# Patient Record
Sex: Female | Born: 1965 | Race: Black or African American | Hispanic: No | Marital: Single | Smoking: Never smoker
Health system: Southern US, Community
[De-identification: ages and names within clinical notes are randomized; demographics above are authoritative.]

## PROBLEM LIST (undated history)

## (undated) DIAGNOSIS — J45909 Unspecified asthma, uncomplicated: Secondary | ICD-10-CM

## (undated) DIAGNOSIS — D729 Disorder of white blood cells, unspecified: Secondary | ICD-10-CM

## (undated) DIAGNOSIS — E669 Obesity, unspecified: Secondary | ICD-10-CM

## (undated) DIAGNOSIS — R011 Cardiac murmur, unspecified: Secondary | ICD-10-CM

## (undated) DIAGNOSIS — J302 Other seasonal allergic rhinitis: Secondary | ICD-10-CM

## (undated) DIAGNOSIS — J310 Chronic rhinitis: Secondary | ICD-10-CM

## (undated) DIAGNOSIS — N912 Amenorrhea, unspecified: Secondary | ICD-10-CM

## (undated) DIAGNOSIS — I10 Essential (primary) hypertension: Secondary | ICD-10-CM

## (undated) HISTORY — DX: Amenorrhea, unspecified: N91.2

## (undated) HISTORY — DX: Disorder of white blood cells, unspecified: D72.9

## (undated) HISTORY — DX: Chronic rhinitis: J31.0

## (undated) HISTORY — DX: Cardiac murmur, unspecified: R01.1

## (undated) HISTORY — DX: Other seasonal allergic rhinitis: J30.2

## (undated) HISTORY — DX: Obesity, unspecified: E66.9

## (undated) HISTORY — DX: Essential (primary) hypertension: I10

---

## 2013-08-23 ENCOUNTER — Encounter (HOSPITAL_COMMUNITY): Payer: Self-pay | Admitting: Emergency Medicine

## 2013-08-23 ENCOUNTER — Emergency Department (HOSPITAL_COMMUNITY): Payer: Self-pay

## 2013-08-23 ENCOUNTER — Emergency Department (HOSPITAL_COMMUNITY)
Admission: EM | Admit: 2013-08-23 | Discharge: 2013-08-23 | Disposition: A | Discharge status: Home / Self Care | Payer: Self-pay | Attending: Emergency Medicine | Admitting: Emergency Medicine

## 2013-08-23 DIAGNOSIS — B349 Viral infection, unspecified: Secondary | ICD-10-CM

## 2013-08-23 DIAGNOSIS — M940 Chondrocostal junction syndrome [Tietze]: Secondary | ICD-10-CM | POA: Insufficient documentation

## 2013-08-23 DIAGNOSIS — B9789 Other viral agents as the cause of diseases classified elsewhere: Secondary | ICD-10-CM | POA: Insufficient documentation

## 2013-08-23 DIAGNOSIS — Z880 Allergy status to penicillin: Secondary | ICD-10-CM | POA: Insufficient documentation

## 2013-08-23 DIAGNOSIS — J45909 Unspecified asthma, uncomplicated: Secondary | ICD-10-CM | POA: Insufficient documentation

## 2013-08-23 DIAGNOSIS — Z79899 Other long term (current) drug therapy: Secondary | ICD-10-CM | POA: Insufficient documentation

## 2013-08-23 HISTORY — DX: Unspecified asthma, uncomplicated: J45.909

## 2013-08-23 LAB — COMPREHENSIVE METABOLIC PANEL
ALT: 22 U/L (ref 0–35)
AST: 22 U/L (ref 0–37)
Albumin: 3.8 g/dL (ref 3.5–5.2)
Alkaline Phosphatase: 56 U/L (ref 39–117)
BUN: 10 mg/dL (ref 6–23)
CALCIUM: 9.1 mg/dL (ref 8.4–10.5)
CO2: 25 mEq/L (ref 19–32)
CREATININE: 0.97 mg/dL (ref 0.50–1.10)
Chloride: 103 mEq/L (ref 96–112)
GFR, EST AFRICAN AMERICAN: 80 mL/min — AB (ref >90)
GFR, EST NON AFRICAN AMERICAN: 69 mL/min — AB (ref >90)
GLUCOSE: 97 mg/dL (ref 70–99)
Potassium: 4 mEq/L (ref 3.7–5.3)
Sodium: 140 mEq/L (ref 137–147)
TOTAL PROTEIN: 7.6 g/dL (ref 6.0–8.3)
Total Bilirubin: 0.5 mg/dL (ref 0.3–1.2)

## 2013-08-23 LAB — CBC
HEMATOCRIT: 36.4 % (ref 36.0–46.0)
HEMOGLOBIN: 11.8 g/dL — AB (ref 12.0–15.0)
MCH: 24.9 pg — AB (ref 26.0–34.0)
MCHC: 32.4 g/dL (ref 30.0–36.0)
MCV: 76.8 fL — ABNORMAL LOW (ref 78.0–100.0)
Platelets: 366 10*3/uL (ref 150–400)
RBC: 4.74 MIL/uL (ref 3.87–5.11)
RDW: 13.1 % (ref 11.5–15.5)
WBC: 4.2 10*3/uL (ref 4.0–10.5)

## 2013-08-23 LAB — TROPONIN I

## 2013-08-23 MED ORDER — IBUPROFEN 800 MG PO TABS: 800.0000 mg | ORAL_TABLET | Freq: Three times a day (TID) | ORAL | Status: AC

## 2013-08-23 MED ORDER — BENZONATATE 100 MG PO CAPS: 100.0000 mg | ORAL_CAPSULE | Freq: Three times a day (TID) | ORAL | Status: AC

## 2013-08-23 MED ORDER — SODIUM CHLORIDE 0.9 % IV BOLUS (SEPSIS): 500.0000 mL | Freq: Once | INTRAVENOUS | Status: DC

## 2013-08-23 MED ORDER — ALBUTEROL SULFATE HFA 108 (90 BASE) MCG/ACT IN AERS
2.0000 | INHALATION_SPRAY | Freq: Once | RESPIRATORY_TRACT | Status: DC
  Filled 2013-08-23: qty 6.7

## 2013-08-23 NOTE — Progress Notes (Signed)
[facility] provided pt with a list of primary care resources, highlighting [facility]. Patient stated that she was "basically homeless" and living with different relatives.

## 2013-08-23 NOTE — Discharge Instructions (Signed)
Please call and set-up an appointment with Urgent Care Center to be re-evaluated Please rest and stay hydrated Please take medications as prescribed - please take on a full stomach Please use inhaler as needed for shortness of breath Please continue to monitor symptoms closely and if symptoms are to worsen or change (fever greater than 101, chills, neck pain, neck stiffness, chest pain, shortness of breath, difficulty breathing, coughing up blood, decreased appetite, vomiting, nausea, stomach pain, changes to appetite, changes to bowel movements and urine) please report back to the ED immediately  Viral Infections A virus is a type of germ. Viruses can cause:  Minor sore throats.  Aches and pains.  Headaches.  Runny nose.  Rashes.  Watery eyes.  Tiredness.  Coughs.  Loss of appetite.  Feeling sick to your stomach (nausea).  Throwing up (vomiting).  Watery poop (diarrhea). HOME CARE   Only take medicines as told by your doctor.  Drink enough water and fluids to keep your pee (urine) clear or pale yellow. Sports drinks are a good choice.  Get plenty of rest and eat healthy. Soups and broths with crackers or rice are fine. GET HELP RIGHT AWAY IF:   You have a very bad headache.  You have shortness of breath.  You have chest pain or neck pain.  You have an unusual rash.  You cannot stop throwing up.  You have watery poop that does not stop.  You cannot keep fluids down.  You or your child has a temperature by mouth above 102 F (38.9 C), not controlled by medicine.  Your baby is older than 3 months with a rectal temperature of 102 F (38.9 C) or higher.  Your baby is 34 months old or younger with a rectal temperature of 100.4 F (38 C) or higher. MAKE SURE YOU:   Understand these instructions.  Will watch this condition.  Will get help right away if you are not doing well or get worse. Document Released: 06/24/2008 Document Revised: 10/04/2011 Document  Reviewed: 11/17/2010 [facility] Patient Information 2014 [place], [place].   Emergency Department Resource Guide 1) Find a Doctor and Pay Out of Pocket Although you won't have to find out who is covered by your insurance plan, it is a good idea to ask around and get recommendations. You will then need to call the office and see if the doctor you have chosen will accept you as a new patient and what types of options they offer for patients who are self-pay. Some doctors offer discounts or will set up payment plans for their patients who do not have insurance, but you will need to ask so you aren't surprised when you get to your appointment.  2) Contact Your Local Health Department Not all health departments have doctors that can see patients for sick visits, but many do, so it is worth a call to see if yours does. If you don't know where your local health department is, you can check in your phone book. The CDC also has a tool to help you locate your state's health department, and many state websites also have listings of all of their local health departments.  3) Find a Walk-in Clinic If your illness is not likely to be very severe or complicated, you may want to try a walk in clinic. These are popping up all over the country in pharmacies, drugstores, and shopping centers. They're usually staffed by nurse practitioners or physician assistants that have been trained to treat common illnesses  and complaints. They're usually fairly quick and inexpensive. However, if you have serious medical issues or chronic medical problems, these are probably not your best option.  No Primary Care Doctor: - Call Health Connect at  [phone] - they can help you locate a primary care doctor that  accepts your insurance, provides certain services, etc. - Physician Referral Service- [phone]  Chronic Pain Problems: Organization         Address  Phone   Notes  [facility]  [phone] Patients  need to be referred by their primary care doctor.   Medication Assistance: Organization         Address  Phone   Notes  [facility] Medication [facility] [address] [place], [place] [address] [phone] --Must be a resident of [facility] -- Must have NO insurance coverage whatsoever (no Medicaid/ Medicare, etc.) -- The pt. MUST have a primary care doctor that directs their care regularly and follows them in the community   [facility]ist  [phone]   [employer]  [phone]    Agencies that provide inexpensive medical care: Organization         Address  Phone   Notes  [facility]  [phone]   [facility]    [phone]   [facility] [address] [place], [place] [address] [phone]   [facility] [address] [place], [place] [phone]   Planned Parenthood    [phone]   [facility]    [phone]   [facility]  [address], [place] Phone:  [phone], Fax:  [phone] Hours of Operation:  9 am - 6 pm, M-F.  Also accepts Medicaid/Medicare and self-pay.  [facility] for Children  [address], [place] Phone: [phone], Fax: [phone]. Hours of Operation:  8:30 am - 5:30 pm, M-F.  Also accepts Medicaid and self-pay.  [facility] [place] [address] [place] Phone: [phone]   [facility] [address] Natasha Bence [place], [place] [phone], Ext. [phone] Mondays & Thursdays: 7-9 AM.  First 15 patients are seen on a first come, first serve basis.    Medicaid-accepting [facility] Providers:  Organization         Address  Phone   Notes  [facility] [address], [place] [phone] Also accepts self-pay patients.  [facility] [address] Josephs [place], [place]  [phone]     [facility] [address], [place] [phone]   [facility] [address] [place] [phone]   Renaye Rakers [address], [place]   [phone] Only accepts [place] patients after they have their name applied to their card.   Self-Pay (no insurance) in [facility]:  Organization         Address  Phone   Notes  Sickle Cell Patients, [facility] [address] [place], [place] [phone]   [facility] [address] [place], [place] [phone]   [facility] [place]  [address], [place] [phone]   [facility]/Dr. Osei-Bonsu  [address], [place] or [address], [address],  [place] [phone] Phone number for both [facility] and [place] locations is the same.  [facility] [address], [place] [phone]) [phone]   [facility] [address] [place] [address], [place] or [address] [phone]   [facility] [address], [place] [phone], phone; [phone]) [phone], fax Sees patients 1st and 3rd Saturday of every month.  Must not qualify for public or private insurance (i.e. Medicaid, Medicare, [place] Health Choice, Veterans' Benefits)  Household income should be no more than 200% of the poverty level The clinic cannot treat you if you are pregnant or think you are pregnant  Sexually transmitted diseases are not treated at the clinic.    Dental Care: Organization         Address  Phone  Notes  [facility] [address], [place] [phone] Accepts children up to age 47 who are enrolled in [place] or [place] Health Choice; pregnant women with a Medicaid card; and children who have applied for Medicaid or [place] Health Choice, but were declined,  whose parents can pay a reduced fee at time of service.  [facility] [place]  [address], [place] [phone] Accepts children up to age 47 who are enrolled in [place] or [place] Health Choice; pregnant women with a Medicaid card; and children who have applied for Medicaid or [place] Health Choice, but were declined, whose parents can pay a reduced fee at time of service.  [facility]  [address], [place] [phone] Patients are seen by appointment only. Walk-ins are not accepted. [facility] will see patients 47 years of age and older. Monday - Tuesday (8am-5pm) Most Wednesdays (8:30-5pm) $30 per visit, cash only  [facility]  [address], [facility] [phone] Patients are seen by appointment only. Walk-ins are not accepted. [facility] will see patients 47 years of age and older. One Wednesday Evening (Monthly: Volunteer Based).  $30 per visit, cash only  [facility]  [phone] for adults; Children under age 744, call Graduate Pediatric Dentistry at [phone]. Children aged 514-14, please call [phone] to request a pediatric application.  Dental services are provided in all areas of dental care including fillings, crowns and bridges, complete and partial dentures, implants, gum treatment, root canals, and extractions. Preventive care is also provided. Treatment is provided to both adults and children. Patients are selected via a lottery and there is often a waiting list.   [facility] [address], [place]  [phone] [email or web address]   [facility] [address], [place], [place] [phone], Ext. [phone] Second and Fourth Thursday of each month, opens at 6:30 AM; Clinic ends at 9 AM.  Patients are seen on a first-come first-served basis, and a limited number are seen during each clinic.   [facility]  [address], [place], [place] [phone]   Eligibility Requirements You must have lived in [place], [place], or [place] counties for at least the last three months.   You cannot be eligible for state or federal sponsored National Cityhealthcare insurance, including CIGNAVeterans Administration, [place]icaid, or [employer]icare.   You generally cannot be eligible for healthcare insurance through your employer.    How to apply: Eligibility screenings are held every Tuesday and  Wednesday afternoon from 1:00 pm until 4:00 pm. You do not need an appointment for the interview!  [facility] [address] [place], [place] [phone]   [facility] Health Department  [phone]   [facility] Health Department  [phone]   [facility] Health Department  [phone]    Behavioral Health Resources in the Community: Intensive Outpatient Programs Organization         Address  Phone  Notes  [facility] [address] [place], [place] [phone]   [facility] [address], [place], [place] [phone]   [facility] [address] [place], [place]  [phone]   [facility] [address],  [place], [place] [phone] or [phone]   Substance Abuse Resources Organization         Address  Phone  Notes  [facility]  [phone]   [facility]  [phone]   [facility] [place]  [phone]   Floydene Flock  [phone]   Residential & Outpatient Substance Abuse Program  [phone]   Psychological Services Organization         Address  Phone  Notes  [facility]  [phone]   [facility]  [phone]   [facility] Health [address] [place] [phone] or [phone]    Mobile Crisis Teams Organization         Address  Phone  Notes  [facility], Mobile Crisis Care Unit   [phone]   [facility]  [address] [place], [place] [phone]   Doristine Locks [address] [place] [phone]    Self-Help/Support Groups Organization         Address  Phone             Notes  [facility] [place] - variety of support groups  336- I7437963 Call for more information  Narcotics Anonymous (NA), [facility] [address], [employer] [place]  2 meetings at this location   Statistician         Address  Phone  Notes  [facility] [address],    [place]  [phone]   [facility]  [address], [place] [address], [place], [place] [phone]   [facility] [address] [place], [place] [phone] Admissions: 8am-3pm M-F  [facility] [address],    [place], [place] [phone]   [facility] [address] [place], [place], [place] [phone]   [facility] [address],  [place], [place] [phone]   [facility] - Intensive Outpatient [address]ure[address], [place], [place] [phone]   [facility] ([facility]iction [facility] [address] [place]  [place], [place] [phone] or [phone]   [facility] ([facility]) [address], [place], [place] [phone] Accepts Medicaid  [facility] [address]  [place] [phone] Substance Abuse/Addiction Treatment   [facility] Organization         Address  Phone  Notes  [facility]  [phone]   Angie Fava, PhD [address] Ervin Knack [place], [place]   [phone] or [phone]   [facility] Behavioral   [address] [place], [place] [phone]   [facility] [address] [place], [place] [phone] Insurance/Medicaid/sponsorship through [employer] and  Families [address]                                     [place], [place] [phone] Therapy/tele-psych/case  [facility] [address]   [place], [place] [phone]    Dr. Lolly Mustache  [phone]   [facility]  [employer] [facility] Dept. 1) [address] [place] 2) [address], [place] 3)  [address], [place] [phone]  [phone]   [facility] Child Abuse Hotline 479-366-2649 or [phone] (After Hours)

## 2013-08-23 NOTE — ED Notes (Signed)
Per pt, states cold/flu symptoms for about 2 weeks-increase cough and chest congestion/discomfort with cough

## 2013-08-23 NOTE — ED Notes (Signed)
Pt ambulated to bathroom without difficulty.

## 2013-08-23 NOTE — ED Provider Notes (Signed)
CSN: [id number]     Arrival date & time 08/23/13  1402 History   First MD Initiated Contact with Patient 08/23/13 1426     Chief Complaint  Patient presents with  . chest congestion   . Cough   (Consider location/radiation/quality/duration/timing/severity/associated sxs/prior Treatment) The history is provided by the patient. No language interpreter was used.  Dana Moses is a 48 y/o F with PMHx of asthma presenting to the ED with dry cough, nasal congestion, chest tightness, and headache that has been ongoing for the past 2 weeks. Patient reported that in the beginning her symptoms were flu-like with chills, generalized bodyaches and fever - patient reported that these symptoms have now passed, but the cough still lingers. Patient reported that when she coughs she gets chest tightness. Reported that while at rest her chest has a "raw" sensation, with a "vice" sensation when she coughs. Patient reported that she has been using Nyquil and Mucinex with negative relief. Patient reported that she reported the chest tightness is worse with coughing. Denied sore throat, difficulty swallowing, dizziness, abdominal pain, nausea, diarrhea, melena, hematochezia, neck pain, neck stiffness, fever, chills, sick contacts, hemoptysis, periphereal edema.  PCP none   Past Medical History  Diagnosis Date  . Asthma    History reviewed. No pertinent past surgical history. No family history on file. History  Substance Use Topics  . Smoking status: Never Smoker   . Smokeless tobacco: Not on file  . Alcohol Use: No   OB History   Grav Para Term Preterm Abortions TAB SAB Ect Mult Living                 Review of Systems  Constitutional: Negative for fever and chills.  HENT: Negative for sore throat and trouble swallowing.   Respiratory: Positive for cough and chest tightness.   Cardiovascular: Negative for chest pain.  Gastrointestinal: Negative for nausea, vomiting, abdominal pain and diarrhea.   Musculoskeletal: Negative for back pain, neck pain and neck stiffness.  Neurological: Positive for headaches. Negative for dizziness and weakness.  All other systems reviewed and are negative.    Allergies  Penicillins  Home Medications   Current Outpatient Rx  Name  Route  Sig  Dispense  Refill  . ibuprofen (ADVIL,MOTRIN) 800 MG tablet   Oral   Take 800 mg by mouth every 8 (eight) hours as needed.         Marland Kitchen Phenylephrine-APAP-Guaifenesin (MUCINEX FAST-MAX COLD & SINUS PO)   Oral   Take 30 mLs by mouth daily.         . Pseudoeph-Doxylamine-DM-APAP (NYQUIL PO)   Oral   Take 30 mLs by mouth at bedtime.         . benzonatate (TESSALON) 100 MG capsule   Oral   Take 1 capsule (100 mg total) by mouth every 8 (eight) hours.   17 capsule   0   . ibuprofen (ADVIL,MOTRIN) 800 MG tablet   Oral   Take 1 tablet (800 mg total) by mouth 3 (three) times daily.   21 tablet   0    BP 138/86  Pulse 68  Temp(Src) 98.6 F (37 C) (Oral)  Resp 16  SpO2 98%  LMP 08/11/2013 Physical Exam  Nursing note and vitals reviewed. Constitutional: She is oriented to person, place, and time. She appears well-developed and well-nourished. No distress.  HENT:  Head: Normocephalic and atraumatic.  Mouth/Throat: Oropharynx is clear and moist. No oropharyngeal exudate.  Eyes: Conjunctivae and EOM are normal.  Pupils are equal, round, and reactive to light. Right eye exhibits no discharge. Left eye exhibits no discharge.  Neck: Normal range of motion. Neck supple. No tracheal deviation present.  Cardiovascular: Normal rate, regular rhythm and normal heart sounds.  Exam reveals no friction rub.   No murmur heard. Pulses:      Radial pulses are 2+ on the right side, and 2+ on the left side.       Dorsalis pedis pulses are 2+ on the right side, and 2+ on the left side.  Negative bilateral swelling or pitting edema noted Negative changes to colors of the legs Cap refill < 3 seconds    Pulmonary/Chest: Effort normal and breath sounds normal. No respiratory distress. She has no wheezes. She has no rales. She exhibits no tenderness.  Patient is able to speak in full sentences without difficulty Negative use of accessory muscles Negative signs of respiratory distress  Musculoskeletal: Normal range of motion.  Full ROM to upper and lower extremities without difficulty noted, negative ataxia noted.  Lymphadenopathy:    She has no cervical adenopathy.  Neurological: She is alert and oriented to person, place, and time. No cranial nerve deficit. She exhibits normal muscle tone. Coordination normal.  Skin: Skin is warm and dry. No rash noted. She is not diaphoretic. No erythema.  Psychiatric: She has a normal mood and affect. Her behavior is normal. Thought content normal.    ED Course  Procedures (including critical care time)  4:49 PM This provider was made aware that the patient does not want to stay for fluids. Reported that patient needed to leave to be somewhere around 5:00PM. This provider discussed lab findings with the patient and imaging results. Patient reported that she is feeling fine and really just wanted something to control her cough.   Results for orders placed during the hospital encounter of 08/23/13  CBC      Result Value Range   WBC 4.2  4.0 - 10.5 K/uL   RBC 4.74  3.87 - 5.11 MIL/uL   Hemoglobin 11.8 (*) 12.0 - 15.0 g/dL   HCT 40.9  81.1 - 91.4 %   MCV 76.8 (*) 78.0 - 100.0 fL   MCH 24.9 (*) 26.0 - 34.0 pg   MCHC 32.4  30.0 - 36.0 g/dL   RDW 78.2  95.6 - 21.3 %   Platelets 366  150 - 400 K/uL  COMPREHENSIVE METABOLIC PANEL      Result Value Range   Sodium 140  137 - 147 mEq/L   Potassium 4.0  3.7 - 5.3 mEq/L   Chloride 103  96 - 112 mEq/L   CO2 25  19 - 32 mEq/L   Glucose, Bld 97  70 - 99 mg/dL   BUN 10  6 - 23 mg/dL   Creatinine, Ser 0.86  0.50 - 1.10 mg/dL   Calcium 9.1  8.4 - 57.8 mg/dL   Total Protein 7.6  6.0 - 8.3 g/dL   Albumin 3.8   3.5 - 5.2 g/dL   AST 22  0 - 37 U/L   ALT 22  0 - 35 U/L   Alkaline Phosphatase 56  39 - 117 U/L   Total Bilirubin 0.5  0.3 - 1.2 mg/dL   GFR calc non Af Amer 69 (*) >90 mL/min   GFR calc Af Amer 80 (*) >90 mL/min  TROPONIN I      Result Value Range   Troponin I <0.30  <0.30 ng/mL   Dg  Chest Port 1 View  08/23/2013   CLINICAL DATA:  Cough and congestion and chest pain with history of asthma  EXAM: PORTABLE CHEST - 1 VIEW  COMPARISON:  None.  FINDINGS: The lungs are adequately inflated. There is minimal prominence of the perihilar lung markings on the right that may reflect subsegmental atelectasis or scarring. There is no evidence of pneumonia. The cardiac silhouette is normal in size. The pulmonary vascularity is not engorged. There is no pleural effusion.  IMPRESSION: There is no evidence of pneumonia. Minimal subsegmental atelectasis versus scarring in the right perihilar region is present.   Electronically Signed   By: David  [place]   On: 08/23/2013 14:45   Labs Review Labs Reviewed  CBC - Abnormal; Notable for the following:    Hemoglobin 11.8 (*)    MCV 76.8 (*)    MCH 24.9 (*)    All other components within normal limits  COMPREHENSIVE METABOLIC PANEL - Abnormal; Notable for the following:    GFR calc non Af Amer 69 (*)    GFR calc Af Amer 80 (*)    All other components within normal limits  TROPONIN I   Imaging Review Dg Chest Port 1 View  08/23/2013   CLINICAL DATA:  Cough and congestion and chest pain with history of asthma  EXAM: PORTABLE CHEST - 1 VIEW  COMPARISON:  None.  FINDINGS: The lungs are adequately inflated. There is minimal prominence of the perihilar lung markings on the right that may reflect subsegmental atelectasis or scarring. There is no evidence of pneumonia. The cardiac silhouette is normal in size. The pulmonary vascularity is not engorged. There is no pleural effusion.  IMPRESSION: There is no evidence of pneumonia. Minimal subsegmental atelectasis versus  scarring in the right perihilar region is present.   Electronically Signed   By: David  [place]   On: 08/23/2013 14:45    EKG Interpretation    Date/Time:  Thursday August 23 2013 14:10:05 EST Ventricular Rate:  57 PR Interval:  192 QRS Duration: 68 QT Interval:  397 QTC Calculation: 386 R Axis:   60 Text Interpretation:  Sinus rhythm Low voltage, precordial leads Baseline wander in lead(s) II Confirmed by Rhunette Croft, MD, ANKIT ([id number]) on 08/23/2013 4:28:37 PM            MDM   1. Viral syndrome   2. Costochondritis     Medications  sodium chloride 0.9 % bolus 500 mL (500 mLs Intravenous Not Given 08/23/13 1656)  albuterol (PROVENTIL HFA;VENTOLIN HFA) 108 (90 BASE) MCG/ACT inhaler 2 puff (2 puffs Inhalation Not Given 08/23/13 1656)    Filed Vitals:   08/23/13 1405 08/23/13 1647  BP: 149/98 138/86  Pulse: 60 68  Temp: 98.4 F (36.9 C) 98.6 F (37 C)  TempSrc: Oral Oral  Resp: 20 16  SpO2: 100% 98%    Patient presenting to emergency part with dry cough, headache, nasal congestion, chest tightness that has been ongoing for the past 2 weeks. Stated that she experiences chest pain with cough. Reported that she's been using over-the-counter NyQuil, Mucinex with minimal relief.  Alert and oriented. GCS 15. Heart rate and rhythm normal. Lungs clear to auscultation bilaterally to upper and lower lobes. Radial and DP pulses 2+ bilaterally. Cap refill less than 3 seconds. Negative bilateral swelling or pitting edema noted to lower extremities. EKG noted normal sinus rhythm with a heart rate of 57 beats per minute. Troponin negative elevation. CBC negative elevation white blood cell count noted. CMP negative  findings. Chest x-ray negative for pneumonia-minimal subsegmental atelectasis versus scarring in the right perihilar region is present. Negative findings for pleural effusion or pneumothorax. 4:49 PM This provider was made aware that the patient wanted to leave. Patient did not want  to stay for IV fluids to be administered. Patient reported that she had somewhere to be at 5:00PM. This provider spoke with the patient and the patient reported that she is feeling fine, she just wanted something for her cough. Patient reported that she is in no pain. IV fluids noted administered.  Negative findings for pneumonia. Negative for pneumothorax. Doubt fluid overload or CHF exacerbation. PERC score 0 - doubt PE. Doubt cardiac issue. Suspicion to be viral syndrome and chest discomfort secondary to cough - suspicion to be costochondritis. Patient does not meet sepsis criteria. Patient stable, afebrile. Discharged patient. Discharged patient with albuterol, ibuprofen, and tessalon. Discussed with patient to rest and stay hydrated. Discussed with patient that it will take a week or two before she starts to feel back to normal. Discussed with patient to closely monitor symptoms and if symptoms are to worsen or change to report back to the ED - strict return instructions given.  Patient agreed to plan of care, understood, all questions answered.   Raymon MuttonMarissa Arlan Birks, PA-C 08/23/13 1806

## 2013-08-24 NOTE — ED Provider Notes (Signed)
Medical screening examination/treatment/procedure(s) were performed by non-physician practitioner and as supervising physician I was immediately available for consultation/collaboration.  EKG Interpretation    Date/Time:  Thursday August 23 2013 14:10:05 EST Ventricular Rate:  57 PR Interval:  192 QRS Duration: 68 QT Interval:  397 QTC Calculation: 386 R Axis:   60 Text Interpretation:  Sinus rhythm Low voltage, precordial leads Baseline wander in lead(s) II Confirmed by Rhunette CroftNANAVATI, MD, Shamel Galyean ([id number]) on 08/23/2013 4:28:37 PM             Derwood KaplanAnkit Shawan Tosh, MD 08/24/13 [id number]

## 2014-10-14 ENCOUNTER — Other Ambulatory Visit: Payer: Self-pay | Admitting: Family Medicine

## 2014-10-14 DIAGNOSIS — N6320 Unspecified lump in the left breast, unspecified quadrant: Secondary | ICD-10-CM

## 2014-11-05 ENCOUNTER — Other Ambulatory Visit: Payer: Self-pay | Admitting: Family Medicine

## 2014-11-05 ENCOUNTER — Other Ambulatory Visit: Payer: Self-pay

## 2014-11-05 DIAGNOSIS — N6320 Unspecified lump in the left breast, unspecified quadrant: Secondary | ICD-10-CM

## 2014-11-06 ENCOUNTER — Other Ambulatory Visit: Payer: Self-pay

## 2014-11-13 ENCOUNTER — Other Ambulatory Visit: Payer: Self-pay

## 2017-10-11 ENCOUNTER — Other Ambulatory Visit: Payer: Self-pay | Admitting: Family Medicine

## 2017-10-11 ENCOUNTER — Ambulatory Visit
Admission: RE | Admit: 2017-10-11 | Discharge: 2017-10-11 | Disposition: A | Discharge status: Home / Self Care | Payer: BC Managed Care – PPO | Source: Ambulatory Visit | Attending: Family Medicine | Admitting: Family Medicine

## 2017-10-11 DIAGNOSIS — R059 Cough, unspecified: Secondary | ICD-10-CM

## 2017-10-11 DIAGNOSIS — R05 Cough: Secondary | ICD-10-CM

## 2018-01-24 ENCOUNTER — Other Ambulatory Visit (HOSPITAL_COMMUNITY)
Admission: RE | Admit: 2018-01-24 | Discharge: 2018-01-24 | Disposition: A | Discharge status: Home / Self Care | Payer: BC Managed Care – PPO | Source: Ambulatory Visit | Attending: Family Medicine | Admitting: Family Medicine

## 2018-01-24 ENCOUNTER — Other Ambulatory Visit: Payer: Self-pay | Admitting: Family Medicine

## 2018-01-24 DIAGNOSIS — Z01411 Encounter for gynecological examination (general) (routine) with abnormal findings: Secondary | ICD-10-CM | POA: Insufficient documentation

## 2018-01-25 LAB — CYTOLOGY - PAP: Diagnosis: NEGATIVE

## 2019-07-04 ENCOUNTER — Other Ambulatory Visit: Payer: Self-pay

## 2019-07-04 ENCOUNTER — Emergency Department: Payer: BC Managed Care – PPO

## 2019-07-04 ENCOUNTER — Emergency Department
Admission: EM | Admit: 2019-07-04 | Discharge: 2019-07-05 | Disposition: A | Discharge status: Home / Self Care | Payer: BC Managed Care – PPO | Attending: Emergency Medicine | Admitting: Emergency Medicine

## 2019-07-04 ENCOUNTER — Encounter: Payer: Self-pay | Admitting: Emergency Medicine

## 2019-07-04 DIAGNOSIS — R200 Anesthesia of skin: Secondary | ICD-10-CM | POA: Diagnosis not present

## 2019-07-04 DIAGNOSIS — R030 Elevated blood-pressure reading, without diagnosis of hypertension: Secondary | ICD-10-CM | POA: Diagnosis not present

## 2019-07-04 DIAGNOSIS — M545 Low back pain, unspecified: Secondary | ICD-10-CM

## 2019-07-04 DIAGNOSIS — J45909 Unspecified asthma, uncomplicated: Secondary | ICD-10-CM | POA: Insufficient documentation

## 2019-07-04 DIAGNOSIS — M79602 Pain in left arm: Secondary | ICD-10-CM | POA: Diagnosis not present

## 2019-07-04 DIAGNOSIS — M546 Pain in thoracic spine: Secondary | ICD-10-CM | POA: Diagnosis present

## 2019-07-04 DIAGNOSIS — R202 Paresthesia of skin: Secondary | ICD-10-CM | POA: Diagnosis not present

## 2019-07-04 LAB — BASIC METABOLIC PANEL
Anion gap: 8 (ref 5–15)
BUN: 20 mg/dL (ref 6–20)
CO2: 29 mmol/L (ref 22–32)
Calcium: 9.3 mg/dL (ref 8.9–10.3)
Chloride: 104 mmol/L (ref 98–111)
Creatinine, Ser: 1.2 mg/dL — ABNORMAL HIGH (ref 0.44–1.00)
GFR calc Af Amer: >60 mL/min (ref >60)
GFR calc non Af Amer: 52 mL/min — ABNORMAL LOW (ref >60)
Glucose, Bld: 97 mg/dL (ref 70–99)
Potassium: 3.6 mmol/L (ref 3.5–5.1)
Sodium: 141 mmol/L (ref 135–145)

## 2019-07-04 LAB — CBC
HCT: 39.7 % (ref 36.0–46.0)
Hemoglobin: 12.5 g/dL (ref 12.0–15.0)
MCH: 24.3 pg — ABNORMAL LOW (ref 26.0–34.0)
MCHC: 31.5 g/dL (ref 30.0–36.0)
MCV: 77.2 fL — ABNORMAL LOW (ref 80.0–100.0)
Platelets: 325 10*3/uL (ref 150–400)
RBC: 5.14 MIL/uL — ABNORMAL HIGH (ref 3.87–5.11)
RDW: 13.1 % (ref 11.5–15.5)
WBC: 5.7 10*3/uL (ref 4.0–10.5)
nRBC: 0 % (ref 0.0–0.2)

## 2019-07-04 LAB — TROPONIN I (HIGH SENSITIVITY)
Troponin I (High Sensitivity): 8 ng/L (ref <18)
Troponin I (High Sensitivity): 8 ng/L (ref <18)

## 2019-07-04 MED ORDER — CYCLOBENZAPRINE HCL 10 MG PO TABS
5.0000 mg | ORAL_TABLET | Freq: Once | ORAL | Status: AC
  Administered 2019-07-04: 5 mg via ORAL
  Filled 2019-07-04: qty 1

## 2019-07-04 MED ORDER — LIDOCAINE 5 % EX PTCH: 1.0000 | MEDICATED_PATCH | Freq: Two times a day (BID) | CUTANEOUS | 0 refills | Status: AC

## 2019-07-04 MED ORDER — IOHEXOL 350 MG/ML SOLN
100.0000 mL | Freq: Once | INTRAVENOUS | Status: AC | PRN
  Administered 2019-07-04: 100 mL via INTRAVENOUS

## 2019-07-04 MED ORDER — CYCLOBENZAPRINE HCL 5 MG PO TABS: 5.0000 mg | ORAL_TABLET | Freq: Three times a day (TID) | ORAL | 0 refills | Status: AC | PRN

## 2019-07-04 MED ORDER — ACETAMINOPHEN 500 MG PO TABS
1000.0000 mg | ORAL_TABLET | Freq: Once | ORAL | Status: DC
  Filled 2019-07-04: qty 2

## 2019-07-04 MED ORDER — ALBUTEROL SULFATE HFA 108 (90 BASE) MCG/ACT IN AERS: 2.0000 | INHALATION_SPRAY | Freq: Four times a day (QID) | RESPIRATORY_TRACT | 0 refills | Status: AC | PRN

## 2019-07-04 MED ORDER — LIDOCAINE 5 % EX PTCH
1.0000 | MEDICATED_PATCH | CUTANEOUS | Status: DC
  Filled 2019-07-04: qty 1

## 2019-07-04 NOTE — ED Triage Notes (Signed)
Pt here for right upper back pain for one week. Was at urgent care and they sent here for HTN. Pt denies hx of HTN.  Pt denies chest pain but for past 4 days has had a discomfort in her left arm with a tingling sensation at times.  Pain in arm is intermittent.  Present when at rest.

## 2019-07-04 NOTE — ED Notes (Signed)
Pt sitting in lobby with no distress noted; pt updated on wait time and taken to triage for vs and repeat troponin

## 2019-07-04 NOTE — ED Provider Notes (Signed)
[facility] Emergency Department Provider Note   ____________________________________________   First MD Initiated Contact with Patient 07/04/19 2145     (approximate)  I have reviewed the triage vital signs and the nursing notes.   HISTORY  Chief Complaint Back Pain and Arm Pain    HPI Dana Moses is a 53 y.o. female with past history of asthma who presents to the ED complaining of back pain and elevated blood pressure.  Patient reports she has been dealing with pain in her right mid back for the past 4 to 5 days.  She describes it as a dull ache that is not exacerbated or alleviated by anything.  She has not had any associated chest pain or shortness of breath, denies any recent fevers or cough.  She has not noticed any pain or swelling in her lower extremities and denies any recent surgery.  She does report occasional episodes of pain moving into her left arm associated with a numb and tingly feeling.  This sensation is not currently present.  She has been taking ibuprofen at home for this without relief.  She sought care in urgent care earlier today, where she was found to be significantly hypertensive.  She denies any history of hypertension and has never take medication for her blood pressure.  UA was obtained at urgent care and patient states it was negative, but she was referred to the ED for further evaluation for possible dissection.  She denies any saddle anesthesia, difficulty urinating, or weakness in her legs.        Past Medical History:  Diagnosis Date   Asthma     There are no active problems to display for this patient.   History reviewed. No pertinent surgical history.  Prior to Admission medications   Medication Sig Start Date End Date Taking? Authorizing Provider  albuterol (VENTOLIN HFA) 108 (90 Base) MCG/ACT inhaler Inhale 2 puffs into the lungs every 6 (six) hours as needed for wheezing or shortness of breath. 07/04/19    Chesley Noon, MD  benzonatate (TESSALON) 100 MG capsule Take 1 capsule (100 mg total) by mouth every 8 (eight) hours. 08/23/13   Sciacca, Marissa, PA-C  cyclobenzaprine (FLEXERIL) 5 MG tablet Take 1 tablet (5 mg total) by mouth 3 (three) times daily as needed for muscle spasms. 07/04/19   Chesley Noon, MD  ibuprofen (ADVIL,MOTRIN) 800 MG tablet Take 800 mg by mouth every 8 (eight) hours as needed.    [provider]  ibuprofen (ADVIL,MOTRIN) 800 MG tablet Take 1 tablet (800 mg total) by mouth 3 (three) times daily. 08/23/13   Sciacca, Marissa, PA-C  lidocaine (LIDODERM) 5 % Place 1 patch onto the skin every 12 (twelve) hours. Remove & Discard patch within 12 hours or as directed by MD 07/04/19 07/03/20  Chesley Noon, MD  Phenylephrine-APAP-Guaifenesin [facility] FAST-MAX COLD & SINUS PO) Take 30 mLs by mouth daily.    [provider]  Pseudoeph-Doxylamine-DM-APAP (NYQUIL PO) Take 30 mLs by mouth at bedtime.    [provider]    Allergies Penicillins  History reviewed. No pertinent family history.  Social History Social History   Tobacco Use   Smoking status: Never Smoker   Smokeless tobacco: Never Used  Substance Use Topics   Alcohol use: No   Drug use: No    Review of Systems  Constitutional: No fever/chills Eyes: No visual changes. ENT: No sore throat. Cardiovascular: Denies chest pain. Respiratory: Denies shortness of breath. Gastrointestinal: No abdominal pain.  No nausea, no vomiting.  No diarrhea.  No constipation. Genitourinary: Negative for dysuria. Musculoskeletal: Positive for back pain. Skin: Negative for rash. Neurological: Negative for headaches, negative for focal weakness, positive for numbness/tingling.  ____________________________________________   PHYSICAL EXAM:  VITAL SIGNS: ED Triage Vitals  Enc Vitals Group     BP 07/04/19 1751 (!) 199/101     Pulse Rate 07/04/19 1750 82     Resp 07/04/19 1750 18     Temp  07/04/19 1750 98.9 F (37.2 C)     Temp Source 07/04/19 1750 Oral     SpO2 07/04/19 1750 100 %     Weight 07/04/19 1749 219 lb (99.3 kg)     Height 07/04/19 1749 5\' 4"  (1.626 m)     Head Circumference --      Peak Flow --      Pain Score 07/04/19 1749 4     Pain Loc --      Pain Edu? --      Excl. in GC? --     Constitutional: Alert and oriented. Eyes: Conjunctivae are normal. Head: Atraumatic. Nose: No congestion/rhinnorhea. Mouth/Throat: Mucous membranes are moist. Neck: Normal ROM Cardiovascular: Normal rate, regular rhythm. Grossly normal heart sounds.  2+ radial pulses bilaterally, 2+ DP pulses bilaterally. Respiratory: Normal respiratory effort.  No retractions. Lungs CTAB. Gastrointestinal: Soft and nontender. No distention. Genitourinary: deferred Musculoskeletal: No lower extremity tenderness nor edema. Neurologic:  Normal speech and language. No gross focal neurologic deficits are appreciated.  5 out of 5 strength in bilateral lower extremities.  Sensation intact throughout. Skin:  Skin is warm, dry and intact. No rash noted. Psychiatric: Mood and affect are normal. Speech and behavior are normal.  ____________________________________________   LABS (all labs ordered are listed, but only abnormal results are displayed)  Labs Reviewed  BASIC METABOLIC PANEL - Abnormal; Notable for the following components:      Result Value   Creatinine, Ser 1.20 (*)    GFR calc non Af Amer 52 (*)    All other components within normal limits  CBC - Abnormal; Notable for the following components:   RBC 5.14 (*)    MCV 77.2 (*)    MCH 24.3 (*)    All other components within normal limits  TROPONIN I (HIGH SENSITIVITY)  TROPONIN I (HIGH SENSITIVITY)   ____________________________________________  EKG  ED ECG REPORT I, Chesley Noonharles Benedicto Capozzi, the attending physician, personally viewed and interpreted this ECG.   Date: 07/04/2019  EKG Time: 17:48  Rate: 87  Rhythm: normal sinus  rhythm  Axis: Normal  Intervals:none  ST&T Change: None   PROCEDURES  Procedure(s) performed (including Critical Care):  Procedures   ____________________________________________   INITIAL IMPRESSION / ASSESSMENT AND PLAN / ED COURSE       53 year old female with past medical history of asthma presents to the ED for a few days of back pain as well as some pain in her left arm with associated numbness and tingling today.  She was noted to have significantly elevated blood pressure at urgent care and referred to the ED for further evaluation.  Given her back pain as well as arm pain with paresthesias, will assess for dissection.  She does have intact pulses in all extremities.  EKG shows no acute changes and 2 sets of troponin are within normal limits.  Chest x-ray negative for acute process and remainder of labs are unremarkable.  If CTA negative, she would be appropriate for outpatient management.  I doubt  cauda equina given lack of concerning symptoms.  CTA negative for acute process.  Patient with improvement in symptoms following Flexeril and Lidoderm patch.  Will prescribe these for home use, counseled patient to follow-up with PCP for reevaluation of her blood pressure once back pain is better under control.      ____________________________________________   FINAL CLINICAL IMPRESSION(S) / ED DIAGNOSES  Final diagnoses:  Acute right-sided low back pain without sciatica  Elevated BP without diagnosis of hypertension     ED Discharge Orders         Ordered    lidocaine (LIDODERM) 5 %  Every 12 hours     07/04/19 2342    cyclobenzaprine (FLEXERIL) 5 MG tablet  3 times daily PRN     07/04/19 2342    albuterol (VENTOLIN HFA) 108 (90 Base) MCG/ACT inhaler  Every 6 hours PRN     07/04/19 2349           Note:  This document was prepared using Dragon voice recognition software and may include unintentional dictation errors.   Blake Divine, MD 07/04/19 2351

## 2019-07-04 NOTE — ED Triage Notes (Signed)
Pt sent by [facility] for hypertensive crisis, left ar tingling and back pain.

## 2019-07-04 NOTE — ED Notes (Signed)
Pt repositioned in bed. Pt given pillow and warm blanket

## 2019-07-04 NOTE — ED Notes (Signed)
Patient transported to CT 

## 2020-01-31 NOTE — Progress Notes (Deleted)
Cardiology Office Note:    Date:  01/31/2020   ID:  Dana Moses, DOB 04/20/1966, MRN [id number]  PCP:  System, Pcp Not In  Cardiologist:  No primary care provider on file.   Referring MD: Shon Hale, *   No chief complaint on file.   History of Present Illness:    Dana Moses is a 54 y.o. female with a hx of obesity, hypertension, asthma and heart murmur referred by Dr.Fernanda Ludwig Clarks for cardiac evaluation.  ***  Past Medical History:  Diagnosis Date   Abnormal WBC count    Amenorrhea    Asthma    Cardiac murmur    Chronic rhinitis    Hypertension    Obesity    Seasonal allergies     No past surgical history on file.  Current Medications: No outpatient medications have been marked as taking for the 02/01/20 encounter (Appointment) with Lyn Records, MD.     Allergies:   Penicillins   Social History   Socioeconomic History   Marital status: Single    Spouse name: Not on file   Number of children: Not on file   Years of education: Not on file   Highest education level: Not on file  Occupational History   Not on file  Tobacco Use   Smoking status: Never Smoker   Smokeless tobacco: Never Used  Substance and Sexual Activity   Alcohol use: No   Drug use: No   Sexual activity: Not on file  Other Topics Concern   Not on file  Social History Narrative   Not on file   Social Determinants of Health   Financial Resource Strain:    Difficulty of Paying Living Expenses:   Food Insecurity:    Worried About Running Out of Food in the Last Year:    Merchant navy officer of Food in the Last Year:   Transportation Needs:    Freight forwarder (Medical):    Lack of Transportation (Non-Medical):   Physical Activity:    Days of Exercise per Week:    Minutes of Exercise per Session:   Stress:    Feeling of Stress :   Social Connections:    Frequency of Communication with Friends and Family:    Frequency of Social  Gatherings with Friends and Family:    Attends Religious Services:    Active Member of Clubs or Organizations:    Attends Banker Meetings:    Marital Status:      Family History: The patient's family history is not on file.  ROS:   Please see the history of present illness.    ***. All other systems reviewed and are negative.  EKGs/Labs/Other Studies Reviewed:    The following studies were reviewed today: ***  EKG:  EKG ***  Recent Labs: 07/04/2019: BUN 20; Creatinine, Ser 1.20; Hemoglobin 12.5; Platelets 325; Potassium 3.6; Sodium 141  Recent Lipid Panel No results found for: CHOL, TRIG, HDL, CHOLHDL, VLDL, LDLCALC, LDLDIRECT  Physical Exam:    VS:  LMP 08/11/2013     Wt Readings from Last 3 Encounters:  07/04/19 219 lb (99.3 kg)     GEN: ***. No acute distress HEENT: Normal NECK: No JVD. LYMPHATICS: No lymphadenopathy CARDIAC: *** RRR without murmur, gallop, or edema. VASCULAR: *** Normal Pulses. No bruits. RESPIRATORY:  Clear to auscultation without rales, wheezing or rhonchi  ABDOMEN: Soft, non-tender, non-distended, No pulsatile mass, MUSCULOSKELETAL: No deformity  SKIN: Warm and dry NEUROLOGIC:  Alert and oriented  x 3 PSYCHIATRIC:  Normal affect   ASSESSMENT:    1. Heart murmur   2. Essential hypertension   3. Morbid obesity (HCC)   4. Educated about COVID-19 virus infection    PLAN:    In order of problems listed above:  1. ***   Medication Adjustments/Labs and Tests Ordered: Current medicines are reviewed at length with the patient today.  Concerns regarding medicines are outlined above.  No orders of the defined types were placed in this encounter.  No orders of the defined types were placed in this encounter.   There are no Patient Instructions on file for this visit.   Signed, Lesleigh Noe, MD  01/31/2020 7:32 PM    [facility]

## 2020-02-01 ENCOUNTER — Ambulatory Visit: Payer: BC Managed Care – PPO | Admitting: Interventional Cardiology

## 2020-02-04 ENCOUNTER — Encounter: Payer: Self-pay | Admitting: General Practice

## 2021-02-17 IMAGING — CT CT ANGIO CHEST-ABD-PELV FOR DISSECTION W/ AND WO/W CM
2 of 7 series · 14 of 46 positions shown, 16 images · IV contrast (APPLIED)
Comparison: Radiograph earlier this day.

CLINICAL DATA: Back pain and arm tingling.

EXAM:
CT ANGIOGRAPHY CHEST, ABDOMEN AND PELVIS
TECHNIQUE: Multidetector CT imaging through the chest, abdomen and pelvis was
performed using the standard protocol during bolus administration of
intravenous contrast. Multiplanar reconstructed images and MIPs were
obtained and reviewed to evaluate the vascular anatomy.
CONTRAST:  100mL OMNIPAQUE IOHEXOL 350 MG/ML SOLN

[Series 5: axial arterial · axial · arterial · 0.81mm/px · z∈[-795,-282]mm · 11 of 197 slices shown, 13 images]
[im 13/197  soft-tissue]
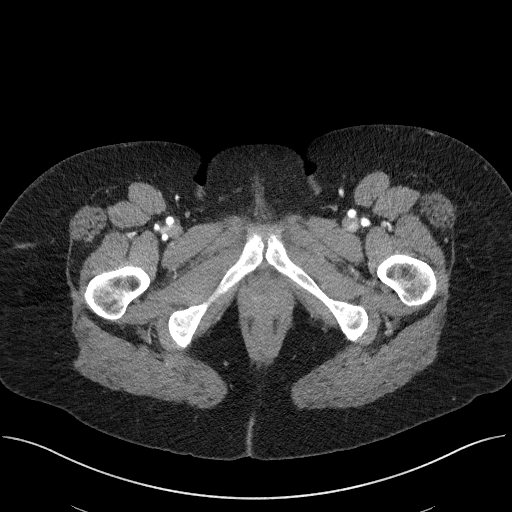
[im 13/197  bone]
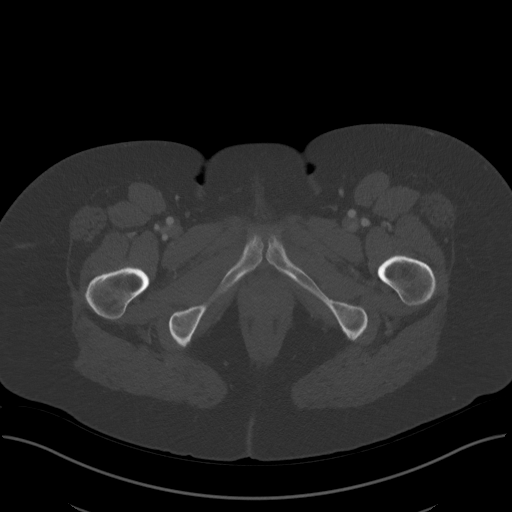
[im 37/197  soft-tissue]
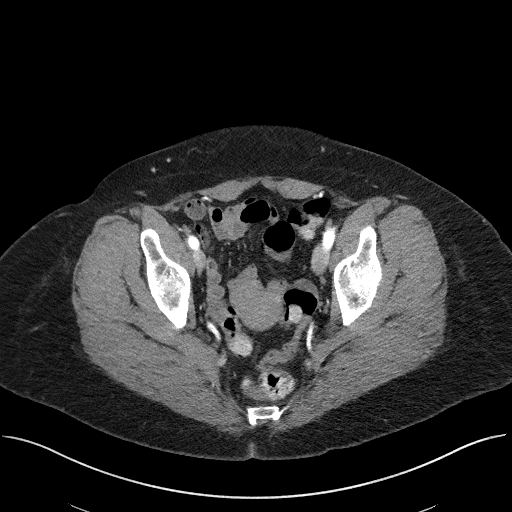
[im 50/197  soft-tissue]
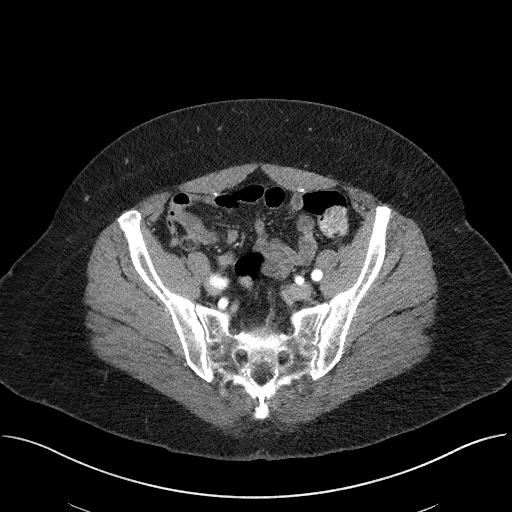
[im 62/197  soft-tissue]
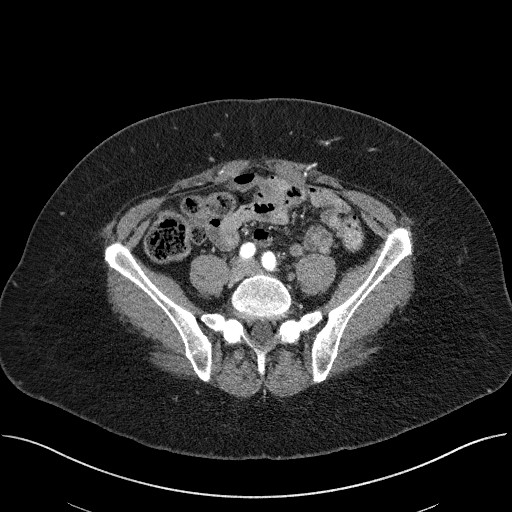
[im 86/197  soft-tissue]
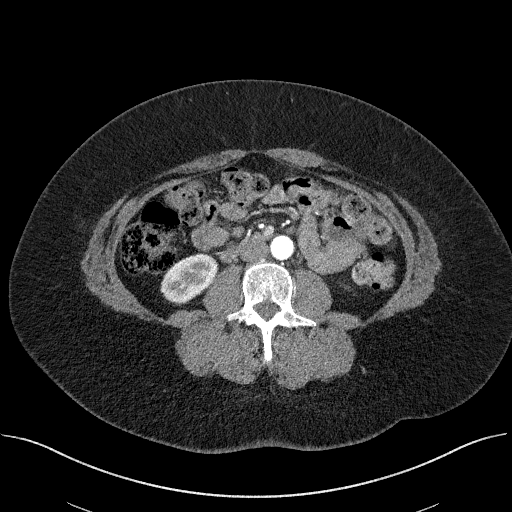
[im 99/197  soft-tissue]
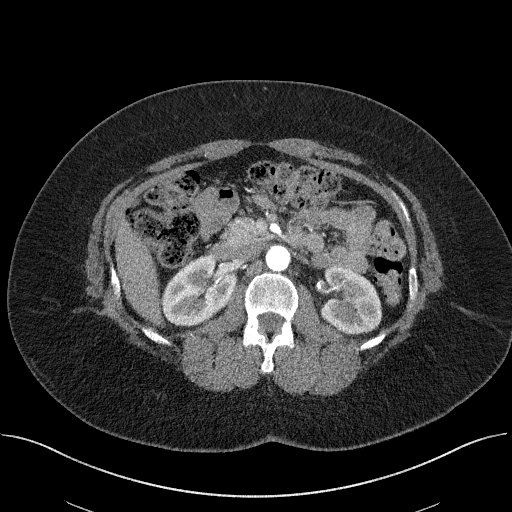
[im 111/197  soft-tissue]
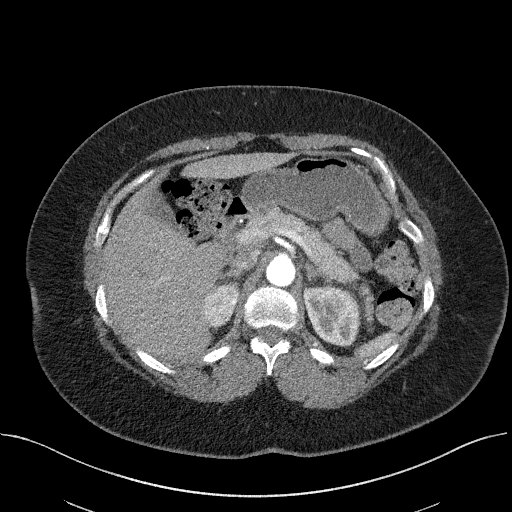
[im 135/197  soft-tissue]
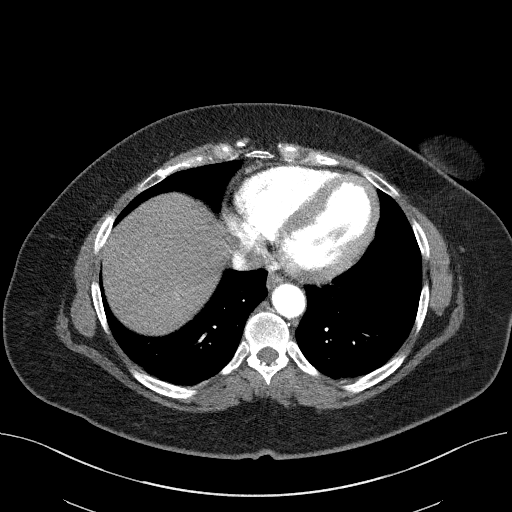
[im 148/197  soft-tissue]
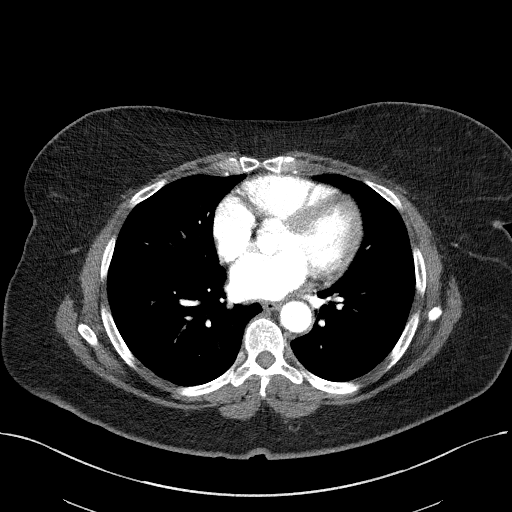
[im 148/197  bone]
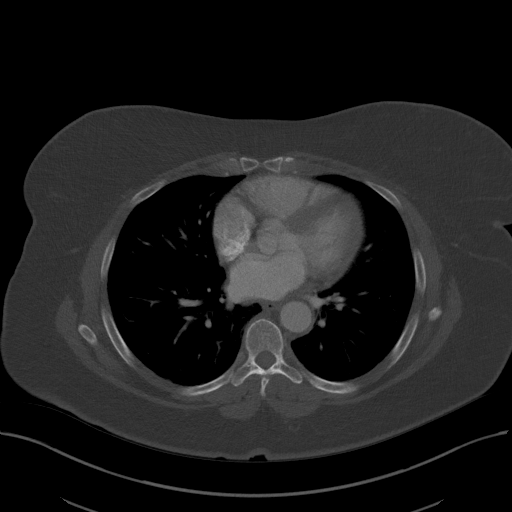
[im 160/197  soft-tissue]
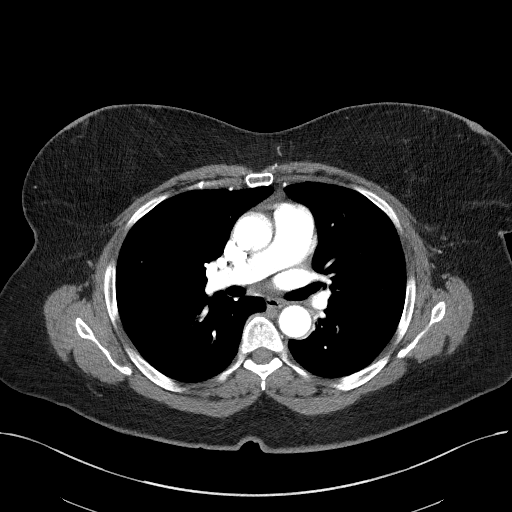
[im 184/197  soft-tissue]
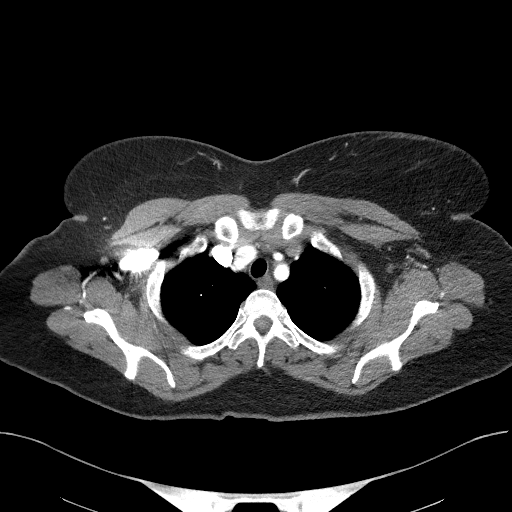

[Series 7: coronals · coronal · 0.79mm/px · 3 of 143 slices shown]
[im 36/143  soft-tissue]
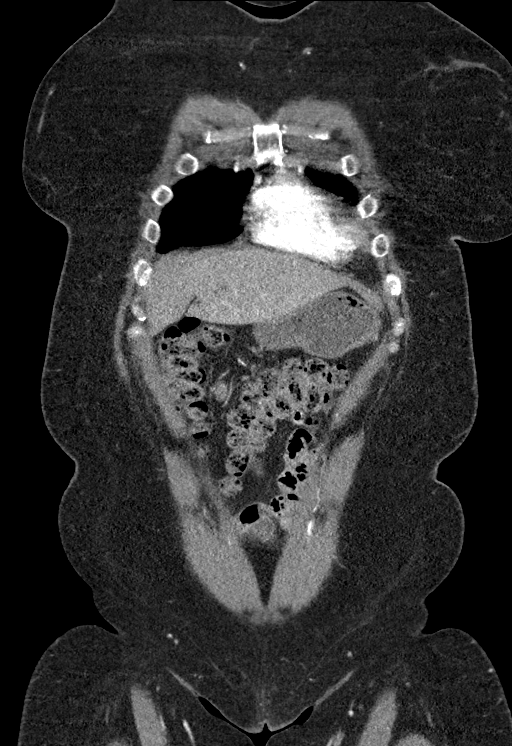
[im 72/143  soft-tissue]
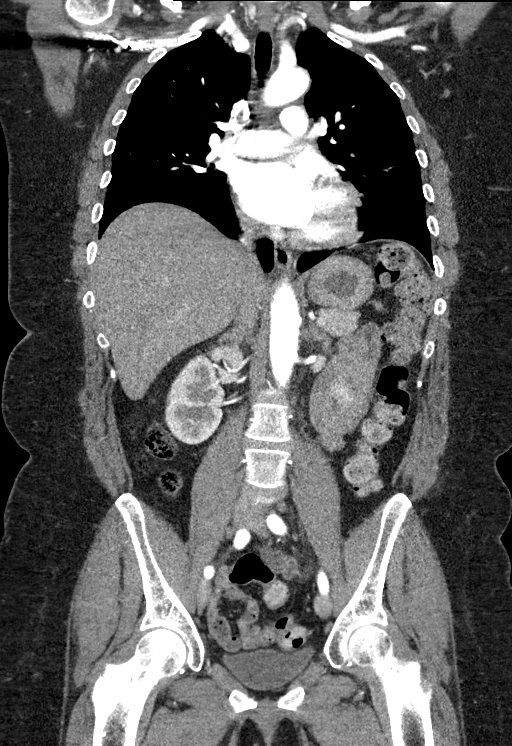
[im 107/143  soft-tissue]
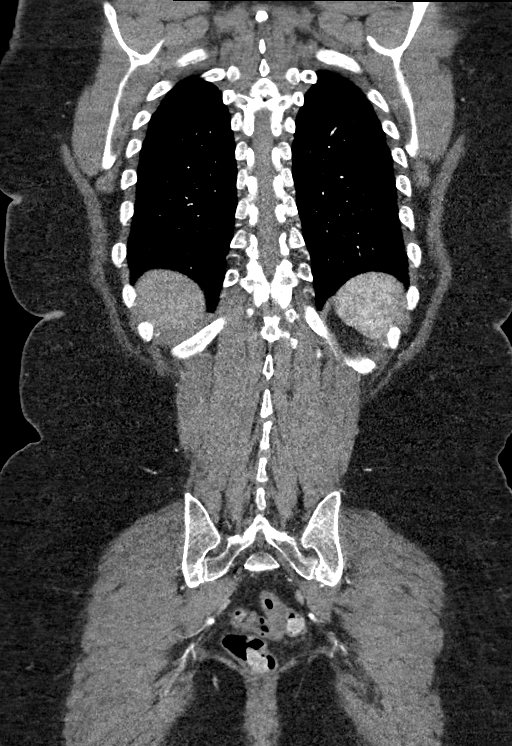

[14 of 46 positions shown; findings below may reference images not displayed]

FINDINGS: CTA CHEST FINDINGS

Cardiovascular: The thoracic aorta is normal in caliber. No
aneurysm. No aortic hematoma, dissection, vasculitis or evidence of
acute aortic syndrome. Conventional branching pattern from the
aortic arch. Branch vessels are patent. Central pulmonary arteries
are patent to the lobar level without pulmonary embolus. Normal
heart size. Trace pericardial effusion adjacent to the right
ventricle measures up to 9 mm.

Mediastinum/Nodes: Triangular soft tissue density in the anterior
mediastinum measuring 4.1 x 1.4 cm with well-defined borders, likely
residual or recurrent thymus. No enlarged mediastinal or hilar lymph
nodes. No esophageal wall thickening. No thyroid nodule.

Lungs/Pleura: Minor hypoventilatory atelectasis in the right lower
lobe. The lungs are otherwise clear. No focal airspace disease. No
pulmonary edema. No pleural fluid. The trachea and mainstem bronchi
are patent.

Musculoskeletal: There are no acute or suspicious osseous
abnormalities.

Review of the MIP images confirms the above findings.

CTA ABDOMEN AND PELVIS FINDINGS

VASCULAR

Aorta: Normal caliber aorta without aneurysm, dissection, vasculitis
or significant stenosis.

Celiac: Patent without evidence of aneurysm, dissection, vasculitis
or significant stenosis.

SMA: Patent without evidence of aneurysm, dissection, vasculitis or
significant stenosis.

Renals: 2 right renal arteries, dominant renal arteries playing mid
and upper pole. Single left renal artery. Renal arteries are patent.
No dissection, aneurysm, vasculitis or fibromuscular dysplasia.

IMA: Patent without evidence of aneurysm, dissection, vasculitis or
significant stenosis.

Inflow: Patent without evidence of aneurysm, dissection, vasculitis
or significant stenosis.

Veins: No obvious venous abnormality within the limitations of this
arterial phase study.

Review of the MIP images confirms the above findings.

NON-VASCULAR

Hepatobiliary: No focal liver abnormality is seen. No gallstones,
gallbladder wall thickening, or biliary dilatation.

Pancreas: No ductal dilatation or inflammation.

Spleen: Normal in size without focal abnormality.

Adrenals/Urinary Tract: Normal adrenal glands. No hydronephrosis or
perinephric edema. Small low-density lesion in the lower left kidney
is incompletely characterized but likely cyst. Urinary bladder is
minimally distended. No bladder wall thickening.

Stomach/Bowel: Fluid/ingested material in the stomach. No gastric
wall thickening. No small bowel obstruction or inflammatory change.
Normal appendix. Moderate colonic stool burden. The transverse and
sigmoid colon are tortuous.

Lymphatic: No enlarged abdominal or pelvic lymph nodes.

Reproductive: Uterus and bilateral adnexa are unremarkable.

Other: No ascites or free air. Tiny fat containing umbilical hernia.

Musculoskeletal: There are no acute or suspicious osseous
abnormalities.

Review of the MIP images confirms the above findings.
IMPRESSION: 1. Normal thoracoabdominal aorta without dissection or acute aortic
abnormality.
2. Trace pericardial effusion.
3. Tracheal soft tissue density in the anterior mediastinum is most
consistent with thymus.
4. No acute abnormality scratch abdomen or pelvis.
5. Colonic redundancy with moderate colonic stool burden, recommend
correlation for constipation.

## 2021-05-28 ENCOUNTER — Other Ambulatory Visit (HOSPITAL_COMMUNITY)
Admission: RE | Admit: 2021-05-28 | Discharge: 2021-05-28 | Disposition: A | Discharge status: Home / Self Care | Payer: BC Managed Care – PPO | Source: Ambulatory Visit | Attending: Family Medicine | Admitting: Family Medicine

## 2021-05-28 ENCOUNTER — Other Ambulatory Visit: Payer: Self-pay | Admitting: Family Medicine

## 2021-05-28 DIAGNOSIS — Z01411 Encounter for gynecological examination (general) (routine) with abnormal findings: Secondary | ICD-10-CM | POA: Diagnosis not present

## 2021-06-01 LAB — CYTOLOGY - PAP
Comment: NEGATIVE
Diagnosis: NEGATIVE
High risk HPV: NEGATIVE
